# Patient Record
Sex: Male | Born: 1975 | Race: White | Hispanic: No | Marital: Single | Smoking: Never smoker
Health system: Southern US, Community
[De-identification: ages and names within clinical notes are randomized; demographics above are authoritative.]

## PROBLEM LIST (undated history)

## (undated) DIAGNOSIS — I10 Essential (primary) hypertension: Secondary | ICD-10-CM

## (undated) HISTORY — PX: APPENDECTOMY: SHX54

## (undated) HISTORY — PX: BACK SURGERY: SHX140

---

## 2006-03-04 ENCOUNTER — Emergency Department (HOSPITAL_COMMUNITY): Admission: EM | Admit: 2006-03-04 | Discharge: 2006-03-04 | Payer: Self-pay | Admitting: Emergency Medicine

## 2006-03-17 ENCOUNTER — Emergency Department (HOSPITAL_COMMUNITY): Admission: EM | Admit: 2006-03-17 | Discharge: 2006-03-17 | Payer: Self-pay | Admitting: Emergency Medicine

## 2006-03-20 ENCOUNTER — Emergency Department (HOSPITAL_COMMUNITY): Admission: EM | Admit: 2006-03-20 | Discharge: 2006-03-21 | Payer: Self-pay | Admitting: Emergency Medicine

## 2006-03-26 ENCOUNTER — Emergency Department (HOSPITAL_COMMUNITY): Admission: EM | Admit: 2006-03-26 | Discharge: 2006-03-26 | Payer: Self-pay | Admitting: Emergency Medicine

## 2006-04-11 ENCOUNTER — Emergency Department (HOSPITAL_COMMUNITY): Admission: EM | Admit: 2006-04-11 | Discharge: 2006-04-11 | Payer: Self-pay | Admitting: Emergency Medicine

## 2006-07-10 ENCOUNTER — Emergency Department (HOSPITAL_COMMUNITY): Admission: EM | Admit: 2006-07-10 | Discharge: 2006-07-11 | Payer: Self-pay | Admitting: Emergency Medicine

## 2006-07-12 ENCOUNTER — Emergency Department (HOSPITAL_COMMUNITY): Admission: EM | Admit: 2006-07-12 | Discharge: 2006-07-12 | Payer: Self-pay | Admitting: Emergency Medicine

## 2007-01-05 ENCOUNTER — Emergency Department (HOSPITAL_COMMUNITY): Admission: EM | Admit: 2007-01-05 | Discharge: 2007-01-05 | Payer: Self-pay | Admitting: Emergency Medicine

## 2007-04-14 ENCOUNTER — Emergency Department (HOSPITAL_COMMUNITY): Admission: EM | Admit: 2007-04-14 | Discharge: 2007-04-14 | Payer: Self-pay | Admitting: Emergency Medicine

## 2007-09-23 ENCOUNTER — Emergency Department (HOSPITAL_COMMUNITY): Admission: EM | Admit: 2007-09-23 | Discharge: 2007-09-23 | Payer: Self-pay | Admitting: Emergency Medicine

## 2007-10-25 ENCOUNTER — Emergency Department (HOSPITAL_COMMUNITY): Admission: EM | Admit: 2007-10-25 | Discharge: 2007-10-25 | Payer: Self-pay | Admitting: Emergency Medicine

## 2007-11-03 ENCOUNTER — Emergency Department (HOSPITAL_COMMUNITY): Admission: EM | Admit: 2007-11-03 | Discharge: 2007-11-03 | Payer: Self-pay | Admitting: Emergency Medicine

## 2007-12-18 ENCOUNTER — Emergency Department (HOSPITAL_COMMUNITY): Admission: EM | Admit: 2007-12-18 | Discharge: 2007-12-18 | Payer: Self-pay | Admitting: Emergency Medicine

## 2009-05-17 ENCOUNTER — Emergency Department (HOSPITAL_COMMUNITY): Admission: EM | Admit: 2009-05-17 | Discharge: 2009-05-17 | Payer: Self-pay | Admitting: Emergency Medicine

## 2009-08-07 ENCOUNTER — Emergency Department (HOSPITAL_COMMUNITY): Admission: EM | Admit: 2009-08-07 | Discharge: 2009-08-07 | Payer: Self-pay | Admitting: Emergency Medicine

## 2010-07-16 ENCOUNTER — Emergency Department (HOSPITAL_COMMUNITY): Admission: EM | Admit: 2010-07-16 | Discharge: 2010-07-16 | Payer: Self-pay | Admitting: Emergency Medicine

## 2011-03-26 ENCOUNTER — Emergency Department (HOSPITAL_COMMUNITY)
Admission: EM | Admit: 2011-03-26 | Discharge: 2011-03-27 | Disposition: A | Discharge status: Home / Self Care | Payer: Self-pay | Attending: Emergency Medicine | Admitting: Emergency Medicine

## 2011-03-26 DIAGNOSIS — M25569 Pain in unspecified knee: Secondary | ICD-10-CM | POA: Insufficient documentation

## 2011-03-26 DIAGNOSIS — IMO0002 Reserved for concepts with insufficient information to code with codable children: Secondary | ICD-10-CM | POA: Insufficient documentation

## 2011-03-27 ENCOUNTER — Emergency Department (HOSPITAL_COMMUNITY): Payer: Self-pay

## 2015-12-14 ENCOUNTER — Emergency Department (HOSPITAL_COMMUNITY): Payer: Self-pay

## 2015-12-14 ENCOUNTER — Encounter (HOSPITAL_COMMUNITY): Payer: Self-pay | Admitting: Emergency Medicine

## 2015-12-14 ENCOUNTER — Emergency Department (HOSPITAL_COMMUNITY)
Admission: EM | Admit: 2015-12-14 | Discharge: 2015-12-14 | Disposition: A | Discharge status: Home / Self Care | Payer: Self-pay | Attending: Emergency Medicine | Admitting: Emergency Medicine

## 2015-12-14 DIAGNOSIS — Y9389 Activity, other specified: Secondary | ICD-10-CM | POA: Insufficient documentation

## 2015-12-14 DIAGNOSIS — Z87891 Personal history of nicotine dependence: Secondary | ICD-10-CM | POA: Insufficient documentation

## 2015-12-14 DIAGNOSIS — Y990 Civilian activity done for income or pay: Secondary | ICD-10-CM | POA: Insufficient documentation

## 2015-12-14 DIAGNOSIS — I10 Essential (primary) hypertension: Secondary | ICD-10-CM | POA: Insufficient documentation

## 2015-12-14 DIAGNOSIS — Y92195 Garage of other specified residential institution as the place of occurrence of the external cause: Secondary | ICD-10-CM | POA: Insufficient documentation

## 2015-12-14 DIAGNOSIS — S20212A Contusion of left front wall of thorax, initial encounter: Secondary | ICD-10-CM | POA: Insufficient documentation

## 2015-12-14 DIAGNOSIS — W01198A Fall on same level from slipping, tripping and stumbling with subsequent striking against other object, initial encounter: Secondary | ICD-10-CM | POA: Insufficient documentation

## 2015-12-14 DIAGNOSIS — Z79899 Other long term (current) drug therapy: Secondary | ICD-10-CM | POA: Insufficient documentation

## 2015-12-14 HISTORY — DX: Essential (primary) hypertension: I10

## 2015-12-14 MED ORDER — KETOROLAC TROMETHAMINE 60 MG/2ML IM SOLN
60.0000 mg | Freq: Once | INTRAMUSCULAR | Status: AC
  Administered 2015-12-14: 60 mg via INTRAMUSCULAR
  Filled 2015-12-14: qty 2

## 2015-12-14 MED ORDER — OXYCODONE-ACETAMINOPHEN 5-325 MG PO TABS: 1.0000 | ORAL_TABLET | ORAL | Status: DC | PRN

## 2015-12-14 MED ORDER — OXYCODONE-ACETAMINOPHEN 5-325 MG PO TABS
1.0000 | ORAL_TABLET | Freq: Once | ORAL | Status: AC
  Administered 2015-12-14: 1 via ORAL
  Filled 2015-12-14: qty 1

## 2015-12-14 NOTE — Discharge Instructions (Signed)
Chest Contusion A chest contusion is a deep bruise on your chest area. Contusions are the result of an injury that caused bleeding under the skin. A chest contusion may involve bruising of the skin, muscles, or ribs. The contusion may turn blue, purple, or yellow. Minor injuries will give you a painless contusion, but more severe contusions may stay painful and swollen for a few weeks. CAUSES  A contusion is usually caused by a blow, trauma, or direct force to an area of the body. SYMPTOMS   Swelling and redness of the injured area.  Discoloration of the injured area.  Tenderness and soreness of the injured area.  Pain. DIAGNOSIS  The diagnosis can be made by taking a history and performing a physical exam. An X-ray, CT scan, or MRI may be needed to determine if there were any associated injuries, such as broken bones (fractures) or internal injuries. TREATMENT  Often, the best treatment for a chest contusion is resting, icing, and applying cold compresses to the injured area. Deep breathing exercises may be recommended to reduce the risk of pneumonia. Over-the-counter medicines may also be recommended for pain control. HOME CARE INSTRUCTIONS   Put ice on the injured area.  Put ice in a plastic bag.  Place a towel between your skin and the bag.  Leave the ice on for 15-20 minutes, 03-04 times a day.  You may also apply a heating pad for 20 minutes 3-4 times daily starting on Friday.  Only take over-the-counter or prescription medicines as directed by your caregiver. Your caregiver may recommend avoiding anti-inflammatory medicines (aspirin, ibuprofen, and naproxen) for 48 hours because these medicines may increase bruising.  Rest the injured area.  Perform deep-breathing exercises as directed by your caregiver.  Stop smoking if you smoke.  Do not lift objects over 5 pounds (2.3 kg) for 3 days or longer if recommended by your caregiver. SEEK IMMEDIATE MEDICAL CARE IF:   You  have increased bruising or swelling.  You have pain that is getting worse.  You have difficulty breathing.  You have dizziness, weakness, or fainting.  You have blood in your urine or stool.  You cough up or vomit blood.  Your swelling or pain is not relieved with medicines. MAKE SURE YOU:   Understand these instructions.  Will watch your condition.  Will get help right away if you are not doing well or get worse.   This information is not intended to replace advice given to you by your health care provider. Make sure you discuss any questions you have with your health care provider.   Document Released: 08/22/2001 Document Revised: 08/21/2012 Document Reviewed: 05/20/2012 Elsevier Interactive Patient Education 2016 [employer]   You may take the oxycodone prescribed for pain relief.  This will make you drowsy - do not drive within 4 hours of taking this medication.

## 2015-12-14 NOTE — ED Provider Notes (Signed)
CSN: [id number]     Arrival date & time 12/14/15  1027 History   First MD Initiated Contact with Patient 12/14/15 1142     Chief Complaint  Patient presents with  . Rib Injury    left     (Consider location/radiation/quality/duration/timing/severity/associated sxs/prior Treatment) The history is provided by the patient.   Anthony Oneill is a 40 y.o. male presenting with acute left sided rib pain from injury sustained when he slipped and fell hitting his work bench at home around 4:00 this morning.  He endorses a negative insomnia still is working in his garage when the incident happened.  He reports constant pain which is worsened with deep inspiration, palpation and movement.  He has a distant history of prior rib fractures and he states his pain today is similar.  He has had no medications or treatments prior to arrival for this problem.  He denies shortness of breath, except for reduced deep inspiration secondary to pain.  Denies abdominal pain, nausea, vomiting.     Past Medical History  Diagnosis Date  . Hypertension    Past Surgical History  Procedure Laterality Date  . Back surgery     No family history on file. Social History  Substance Use Topics  . Smoking status: Former Smoker    Quit date: 12/12/2015  . Smokeless tobacco: None  . Alcohol Use: No    Review of Systems  Constitutional: Negative for fever and chills.  HENT: Negative.   Eyes: Negative.   Respiratory: Negative for shortness of breath, wheezing and stridor.   Cardiovascular: Positive for chest pain.  Gastrointestinal: Negative for nausea, vomiting and abdominal pain.  Genitourinary: Negative.   Musculoskeletal: Negative for back pain and neck pain.  Skin: Negative.  Negative for wound.  Neurological: Negative for dizziness, weakness, light-headedness, numbness and headaches.  Psychiatric/Behavioral: Negative.       Allergies  Review of patient's allergies indicates no known allergies.  Home  Medications   Prior to Admission medications   Medication Sig Start Date End Date Taking? Authorizing Provider  cloNIDine (CATAPRES) 0.1 MG tablet Take 0.1 mg by mouth daily.   Yes Historical Provider, MD  hydrochlorothiazide (HYDRODIURIL) 25 MG tablet Take 25 mg by mouth daily.   Yes Historical Provider, MD   BP 147/95 mmHg  Pulse 81  Temp(Src) 98.3 F (36.8 C) (Oral)  Resp 18  Ht 6' (1.829 m)  Wt 90.719 kg  BMI 27.12 kg/m2  SpO2 100% Physical Exam  Constitutional: He appears well-developed and well-nourished.  HENT:  Head: Normocephalic and atraumatic.  Neck: Normal range of motion.  Cardiovascular: Normal rate.   Pulses equal bilaterally  Pulmonary/Chest: No respiratory distress. He has no rales. He exhibits tenderness.    Tender to palpation along the left midaxillary line.  There is no edema, hematoma, bruising, crepitus or palpable deformity.  Abdominal: Soft. Bowel sounds are normal. There is no tenderness. There is no guarding.  Neurological: He is alert. He has normal strength. He displays normal reflexes. No sensory deficit.  Skin: Skin is warm and dry.  Psychiatric: He has a normal mood and affect.    ED Course  Procedures (including critical care time) Labs Review Labs Reviewed - No data to display  Imaging Review Dg Ribs Unilateral W/chest Left  12/14/2015  CLINICAL DATA:  Fall this morning. Left rib injury/pain. Initial encounter. EXAM: LEFT RIBS AND CHEST - 3+ VIEW COMPARISON:  Left rib series 04/14/2007 FINDINGS: The cardiomediastinal silhouette is within normal  limits. The lungs are well inflated and clear. No pleural effusion or pneumothorax is seen. No rib fracture is identified. IMPRESSION: Negative. Electronically Signed   By: Sebastian AcheAllen  Grady M.D.   On: 12/14/2015 11:06   I have personally reviewed and evaluated these images and lab results as part of my medical decision-making.   EKG Interpretation None      MDM   Final diagnoses:  Rib contusion,  left, initial encounter    Radiological studies were viewed, interpreted and considered during the medical decision making and disposition process. I agree with radiologists reading.  Results were also discussed with patient.  He was placed on oxycodone for pain relief.  Also encouraged ibuprofen OTC for home use.  Reassurance given that his symptoms should improve quicker knowingly he does not have a current rib fracture.  He was given incentive spirometer however given the degree of splinting during this visit today, to help minimize the risk of pneumonia complication.  When necessary follow-up anticipated.  The patient appears reasonably screened and/or stabilized for discharge and I doubt any other medical condition or other [facility] requiring further screening, evaluation, or treatment in the ED at this time prior to discharge.      Burgess AmorJulie Maysie Parkhill, PA-C 12/15/15 1659  Lavera Guiseana Duo Liu, MD 12/15/15 2017

## 2015-12-14 NOTE — ED Notes (Signed)
Slip and fell this am, with injury to left rib.  Hit bench, rates pain 8/10.  Pain increases when breathing in.

## 2016-06-26 ENCOUNTER — Emergency Department (HOSPITAL_COMMUNITY): Payer: Self-pay

## 2016-06-26 ENCOUNTER — Encounter (HOSPITAL_COMMUNITY): Payer: Self-pay | Admitting: *Deleted

## 2016-06-26 ENCOUNTER — Emergency Department (HOSPITAL_COMMUNITY)
Admission: EM | Admit: 2016-06-26 | Discharge: 2016-06-26 | Disposition: A | Discharge status: Home / Self Care | Payer: Self-pay | Attending: Emergency Medicine | Admitting: Emergency Medicine

## 2016-06-26 DIAGNOSIS — F17200 Nicotine dependence, unspecified, uncomplicated: Secondary | ICD-10-CM | POA: Insufficient documentation

## 2016-06-26 DIAGNOSIS — R0789 Other chest pain: Secondary | ICD-10-CM | POA: Insufficient documentation

## 2016-06-26 DIAGNOSIS — Z79899 Other long term (current) drug therapy: Secondary | ICD-10-CM | POA: Insufficient documentation

## 2016-06-26 DIAGNOSIS — I10 Essential (primary) hypertension: Secondary | ICD-10-CM | POA: Insufficient documentation

## 2016-06-26 LAB — CBC
HEMATOCRIT: 38.3 % — AB (ref 39.0–52.0)
Hemoglobin: 12.6 g/dL — ABNORMAL LOW (ref 13.0–17.0)
MCH: 29 pg (ref 26.0–34.0)
MCHC: 32.9 g/dL (ref 30.0–36.0)
MCV: 88 fL (ref 78.0–100.0)
Platelets: 239 10*3/uL (ref 150–400)
RBC: 4.35 MIL/uL (ref 4.22–5.81)
RDW: 13.2 % (ref 11.5–15.5)
WBC: 5.3 10*3/uL (ref 4.0–10.5)

## 2016-06-26 LAB — BASIC METABOLIC PANEL
Anion gap: 6 (ref 5–15)
BUN: 11 mg/dL (ref 6–20)
CHLORIDE: 104 mmol/L (ref 101–111)
CO2: 28 mmol/L (ref 22–32)
CREATININE: 0.58 mg/dL — AB (ref 0.61–1.24)
Calcium: 8.8 mg/dL — ABNORMAL LOW (ref 8.9–10.3)
GFR calc Af Amer: >60 mL/min (ref >60)
GFR calc non Af Amer: >60 mL/min (ref >60)
GLUCOSE: 93 mg/dL (ref 65–99)
POTASSIUM: 4.1 mmol/L (ref 3.5–5.1)
Sodium: 138 mmol/L (ref 135–145)

## 2016-06-26 LAB — D-DIMER, QUANTITATIVE: D-Dimer, Quant: 0.34 ug/mL-FEU (ref 0.00–0.50)

## 2016-06-26 LAB — TROPONIN I
Troponin I: <0.03 ng/mL (ref <0.03)
Troponin I: <0.03 ng/mL (ref <0.03)

## 2016-06-26 MED ORDER — IOPAMIDOL (ISOVUE-370) INJECTION 76%
100.0000 mL | Freq: Once | INTRAVENOUS | Status: AC | PRN
  Administered 2016-06-26: 100 mL via INTRAVENOUS

## 2016-06-26 MED ORDER — KETOROLAC TROMETHAMINE 30 MG/ML IJ SOLN
30.0000 mg | Freq: Once | INTRAMUSCULAR | Status: AC
  Administered 2016-06-26: 30 mg via INTRAVENOUS
  Filled 2016-06-26: qty 1

## 2016-06-26 MED ORDER — IBUPROFEN 800 MG PO TABS
800.0000 mg | ORAL_TABLET | Freq: Once | ORAL | Status: AC
  Administered 2016-06-26: 800 mg via ORAL
  Filled 2016-06-26: qty 1

## 2016-06-26 MED ORDER — NAPROXEN 500 MG PO TABS
500.0000 mg | ORAL_TABLET | Freq: Two times a day (BID) | ORAL | Status: DC
Start: 2016-06-26 — End: 2017-01-05

## 2016-06-26 NOTE — ED Notes (Signed)
Pt comes in for generalized body aches, chest pain, and cough lasting 1 week. Pt states he doe shave a productive cough. Chest pain is constant but worsens with movement.

## 2016-06-26 NOTE — ED Notes (Signed)
Pt request more pain medication, EDP aware

## 2016-06-26 NOTE — ED Provider Notes (Signed)
CSN: [id number]     Arrival date & time 06/26/16  1211 History  By signing my name below, I, Anthony Oneill, attest that this documentation has been prepared under the direction and in the presence of Anthony Octave, MD. Electronically Signed: [name], ED Scribe. 06/26/2016. 12:51 PM.  Chief Complaint  Patient presents with  . Chest Pain   HPI HPI Comments: RENJI Oneill is a 40 y.o. male with a PMHx of HTN who presents to the Emergency Department complaining of gradual onset, gradually worsening, constant middle to left sided chest pain x 1 week. He notes that his pain radiates diffusely into his upper middle back. He has had an associated productive cough with green sputum, and generalized body myalgias that began after the onset of his chest pain. Pt reports that he did play hockey last night and notes that his pain was greatly worsened after playing, but denies any trauma or injury. His back and chest pain are exacerbated with coughing. He has been taking Aleve and Ibuprofen for his pain with no relief. He notes that he has sick contact with similar symptoms, and states that they were all dx'd with PNA. No hx of asthma or COPD.  Pt currently takes Clonadine for his HTN. Pt does smoke cigarettes. He denies diaphoresis, fever, HA, rhinorrhea, sore throat or near-syncopal episodes.   Past Medical History  Diagnosis Date  . Hypertension    Past Surgical History  Procedure Laterality Date  . Back surgery     No family history on file. Social History  Substance Use Topics  . Smoking status: Current Some Day Smoker    Last Attempt to Quit: 12/12/2015  . Smokeless tobacco: None  . Alcohol Use: No    Review of Systems A complete 10 system review of systems was obtained and all systems are negative except as noted in the HPI and PMH.   Allergies  Review of patient's allergies indicates no known allergies.  Home Medications   Prior to Admission medications    Medication Sig Start Date End Date Taking? Authorizing Provider  cloNIDine (CATAPRES) 0.1 MG tablet Take 0.1 mg by mouth daily.    Historical Provider, MD  hydrochlorothiazide (HYDRODIURIL) 25 MG tablet Take 25 mg by mouth daily.    Historical Provider, MD  oxyCODONE-acetaminophen (PERCOCET/ROXICET) 5-325 MG tablet Take 1 tablet by mouth every 4 (four) hours as needed. 12/14/15   Burgess Amor, PA-C   BP 163/100 mmHg  Pulse 80  Temp(Src) 98.4 F (36.9 C) (Oral)  Resp 18  Ht 6' (1.829 m)  Wt 190 lb (86.183 kg)  BMI 25.76 kg/m2  SpO2 100%   Physical Exam  Constitutional: He is oriented to person, place, and time. He appears well-developed and well-nourished.  HENT:  Head: Normocephalic and atraumatic.  Eyes: EOM are normal.  Neck: Normal range of motion.  Cardiovascular: Normal rate, regular rhythm and normal heart sounds.   Equal radial pulses.   Pulmonary/Chest: Effort normal. No respiratory distress.  Diminished breathe sound throughout,. TTP to the anterior lateral chest wall.   Abdominal: Soft. He exhibits no distension. There is no tenderness.  Musculoskeletal: Normal range of motion.  Neurological: He is alert and oriented to person, place, and time. He has normal reflexes.  Skin: Skin is warm and dry.  Psychiatric: He has a normal mood and affect. Judgment normal.  Nursing note and vitals reviewed.  ED Course  Procedures (including critical care time)  DIAGNOSTIC STUDIES: Oxygen Saturation is  100% on RA, normal by my interpretation.   COORDINATION OF CARE: 12:51 PM-Discussed next steps with pt. Pt verbalized understanding and is agreeable with the plan.   Labs Review Labs Reviewed  BASIC METABOLIC PANEL - Abnormal; Notable for the following:    Creatinine, Ser 0.58 (*)    Calcium 8.8 (*)    All other components within normal limits  CBC - Abnormal; Notable for the following:    Hemoglobin 12.6 (*)    HCT 38.3 (*)    All other components within normal limits   TROPONIN I  D-DIMER, QUANTITATIVE (NOT AT [facility])  TROPONIN I    Imaging Review Dg Chest 2 View  06/26/2016  CLINICAL DATA:  Cough, shortness of Breath, hypertension EXAM: CHEST  2 VIEW COMPARISON:  12/14/2015 FINDINGS: Cardiomediastinal silhouette is stable. Mild hyperinflation. Minimal degenerative changes lower thoracic spine. No infiltrate or pulmonary edema IMPRESSION: No active disease.  Mild hyperinflation. Electronically Signed   By: Natasha MeadLiviu  Pop M.D.   On: 06/26/2016 13:06   Ct Angio Chest/abd/pel For Dissection W And/or W/wo  06/26/2016  CLINICAL DATA:  Generalized body ache, chest pain cough starting 1 week ago EXAM: CT ANGIOGRAPHY CHEST, ABDOMEN AND PELVIS TECHNIQUE: Multidetector CT imaging through the chest, abdomen and pelvis was performed using the standard protocol during bolus administration of intravenous contrast. Multiplanar reconstructed images and MIPs were obtained and reviewed to evaluate the vascular anatomy. Unenhanced images of the chest also submitted. CONTRAST:  100 cc Isovue COMPARISON:  10/11/2011 FINDINGS: CTA CHEST FINDINGS Unenhanced images of the chest shows no aortic calcifications. No calcified hilar lymph nodes are noted. Arterial enhanced images shows no evidence of aortic aneurysm or dissection. No mediastinal hematoma or adenopathy. No pulmonary embolus is noted. A right hilar lymph node measures 1.2 cm short-axis. Left hilar lymph node measures 1.2 cm short-axis. These are borderline enlarged by size criteria. Images of the lung parenchyma shows no acute infiltrate or pulmonary edema. There is no focal consolidation. No pulmonary nodules are noted. Small hiatal hernia. Mild emphysematous changes are noted especially in upper lobes. Review of the MIP images confirms the above findings. CTA ABDOMEN AND PELVIS FINDINGS No abdominal aortic aneurysm or dissection. No periaortic leak. The celiac trunk and SMA are patent. Bilateral common iliac arteries are patent. Mild  atherosclerotic calcifications of distal abdominal aorta and common iliac arteries. Enhanced liver, pancreas, spleen and adrenal glands are unremarkable. Enhanced kidneys are symmetrical in size. No hydronephrosis or hydroureter. No small bowel obstruction.  No ascites or free air. No adenopathy. Prostate gland and seminal vesicles are unremarkable. The urinary bladder is unremarkable. No pericecal inflammation. The terminal ileum is unremarkable. The appendix is not identified. Review of the MIP images confirms the above findings. IMPRESSION: 1. There is no thoracic aortic aneurysm or aortic dissection. 2. No mediastinal hematoma or adenopathy. 3. Bilateral mild enlarged hilar lymph nodes probable reactive. 4. No acute infiltrate or pulmonary edema. 5. No abdominal aortic aneurysm or dissection. 6. No small bowel obstruction. 7. No hydronephrosis or hydroureter. 8. Mild atherosclerotic calcifications distal abdominal aorta and iliac arteries. 9. No pericecal inflammation. No abdominal ascites or free abdominal air. Electronically Signed   By: Natasha MeadLiviu  Pop M.D.   On: 06/26/2016 15:18    I have personally reviewed and evaluated these images and lab results as part of my medical decision-making.   EKG Interpretation   Date/Time:  Monday June 26 2016 12:16:29 EDT Ventricular Rate:  76 PR Interval:  134 QRS Duration: 84  QT Interval:  380 QTC Calculation: 427 R Axis:   73 Text Interpretation:  Normal sinus rhythm Normal ECG No previous ECGs  available Confirmed by Anwar Crill  MD, Darcy Cordner [phone]) on 06/26/2016 12:48:39  PM      MDM   Final diagnoses:  Atypical chest pain  Essential hypertension   1 week history of chest pain, back pain, cough and diffuse aches. Chest pain is worse with coughing and movement. His pain became worse after playing hockey yesterday but denies any direct trauma.  EKG is normal sinus rhythm. Low suspicion for ACS. Pain has been ongoing for several days and is reproducible  likely secondary to soreness from coughing.  CT Negative for dissection or other acute pathology. Troponin negative 2.  Suspect chest wall pain secondary to coughing. There is no evidence of MI or pneumonia or rib fracture or pneumothorax. Follow-up with PCP. Return precautions discussed. Patient aware of elevated blood pressure and states compliance with his clonidine. Advised further follow-up with PCP regarding his elevated blood pressure. No evidence of end organ damage.  I personally performed the services described in this documentation, which was scribed in my presence. The recorded information has been reviewed and is accurate.    Anthony Octave, MD 06/26/16 [phone]

## 2016-06-26 NOTE — Discharge Instructions (Signed)
Nonspecific Chest Pain  There is no evidence of heart attack or blood clot in the lung. Follow-up with your doctor for further evaluation of your elevated blood pressure. Return to the ED if you develop new or worsening symptoms. Chest pain can be caused by many different conditions. There is always a chance that your pain could be related to something serious, such as a heart attack or a blood clot in your lungs. Chest pain can also be caused by conditions that are not life-threatening. If you have chest pain, it is very important to follow up with your health care provider. CAUSES  Chest pain can be caused by:  Heartburn.  Pneumonia or bronchitis.  Anxiety or stress.  Inflammation around your heart (pericarditis) or lung (pleuritis or pleurisy).  A blood clot in your lung.  A collapsed lung (pneumothorax). It can develop suddenly on its own (spontaneous pneumothorax) or from trauma to the chest.  Shingles infection (varicella-zoster virus).  Heart attack.  Damage to the bones, muscles, and cartilage that make up your chest wall. This can include:  Bruised bones due to injury.  Strained muscles or cartilage due to frequent or repeated coughing or overwork.  Fracture to one or more ribs.  Sore cartilage due to inflammation (costochondritis). RISK FACTORS  Risk factors for chest pain may include:  Activities that increase your risk for trauma or injury to your chest.  Respiratory infections or conditions that cause frequent coughing.  Medical conditions or overeating that can cause heartburn.  Heart disease or family history of heart disease.  Conditions or health behaviors that increase your risk of developing a blood clot.  Having had chicken pox (varicella zoster). SIGNS AND SYMPTOMS Chest pain can feel like:  Burning or tingling on the surface of your chest or deep in your chest.  Crushing, pressure, aching, or squeezing pain.  Dull or sharp pain that is worse  when you move, cough, or take a deep breath.  Pain that is also felt in your back, neck, shoulder, or arm, or pain that spreads to any of these areas. Your chest pain may come and go, or it may stay constant. DIAGNOSIS Lab tests or other studies may be needed to find the cause of your pain. Your health care provider may have you take a test called an ambulatory ECG (electrocardiogram). An ECG records your heartbeat patterns at the time the test is performed. You may also have other tests, such as:  Transthoracic echocardiogram (TTE). During echocardiography, sound waves are used to create a picture of all of the heart structures and to look at how blood flows through your heart.  Transesophageal echocardiogram (TEE).This is a more advanced imaging test that obtains images from inside your body. It allows your health care provider to see your heart in finer detail.  Cardiac monitoring. This allows your health care provider to monitor your heart rate and rhythm in real time.  Holter monitor. This is a portable device that records your heartbeat and can help to diagnose abnormal heartbeats. It allows your health care provider to track your heart activity for several days, if needed.  Stress tests. These can be done through exercise or by taking medicine that makes your heart beat more quickly.  Blood tests.  Imaging tests. TREATMENT  Your treatment depends on what is causing your chest pain. Treatment may include:  Medicines. These may include:  Acid blockers for heartburn.  Anti-inflammatory medicine.  Pain medicine for inflammatory conditions.  Antibiotic medicine,  if an infection is present.  Medicines to dissolve blood clots.  Medicines to treat coronary artery disease.  Supportive care for conditions that do not require medicines. This may include:  Resting.  Applying heat or cold packs to injured areas.  Limiting activities until pain decreases. HOME CARE  INSTRUCTIONS  If you were prescribed an antibiotic medicine, finish it all even if you start to feel better.  Avoid any activities that bring on chest pain.  Do not use any tobacco products, including cigarettes, chewing tobacco, or electronic cigarettes. If you need help quitting, ask your health care provider.  Do not drink alcohol.  Take medicines only as directed by your health care provider.  Keep all follow-up visits as directed by your health care provider. This is important. This includes any further testing if your chest pain does not go away.  If heartburn is the cause for your chest pain, you may be told to keep your head raised (elevated) while sleeping. This reduces the chance that acid will go from your stomach into your esophagus.  Make lifestyle changes as directed by your health care provider. These may include:  Getting regular exercise. Ask your health care provider to suggest some activities that are safe for you.  Eating a heart-healthy diet. A registered dietitian can help you to learn healthy eating options.  Maintaining a healthy weight.  Managing diabetes, if necessary.  Reducing stress. SEEK MEDICAL CARE IF:  Your chest pain does not go away after treatment.  You have a rash with blisters on your chest.  You have a fever. SEEK IMMEDIATE MEDICAL CARE IF:   Your chest pain is worse.  You have an increasing cough, or you cough up blood.  You have severe abdominal pain.  You have severe weakness.  You faint.  You have chills.  You have sudden, unexplained chest discomfort.  You have sudden, unexplained discomfort in your arms, back, neck, or jaw.  You have shortness of breath at any time.  You suddenly start to sweat, or your skin gets clammy.  You feel nauseous or you vomit.  You suddenly feel light-headed or dizzy.  Your heart begins to beat quickly, or it feels like it is skipping beats. These symptoms may represent a serious  problem that is an emergency. Do not wait to see if the symptoms will go away. Get medical help right away. Call your local emergency services (911 in the [place] Do not drive yourself to the hospital.   This information is not intended to replace advice given to you by your health care provider. Make sure you discuss any questions you have with your health care provider.   Document Released: 09/06/2005 Document Revised: 12/18/2014 Document Reviewed: 07/03/2014 Elsevier Interactive Patient Education [employer]2016 [employer]

## 2017-01-05 ENCOUNTER — Emergency Department (HOSPITAL_COMMUNITY)
Admission: EM | Admit: 2017-01-05 | Discharge: 2017-01-05 | Disposition: A | Discharge status: Home / Self Care | Payer: Self-pay | Attending: Emergency Medicine | Admitting: Emergency Medicine

## 2017-01-05 ENCOUNTER — Emergency Department (HOSPITAL_COMMUNITY): Payer: Self-pay

## 2017-01-05 ENCOUNTER — Encounter (HOSPITAL_COMMUNITY): Payer: Self-pay | Admitting: *Deleted

## 2017-01-05 DIAGNOSIS — M542 Cervicalgia: Secondary | ICD-10-CM | POA: Insufficient documentation

## 2017-01-05 DIAGNOSIS — I10 Essential (primary) hypertension: Secondary | ICD-10-CM | POA: Insufficient documentation

## 2017-01-05 DIAGNOSIS — M792 Neuralgia and neuritis, unspecified: Secondary | ICD-10-CM

## 2017-01-05 DIAGNOSIS — Z79899 Other long term (current) drug therapy: Secondary | ICD-10-CM | POA: Insufficient documentation

## 2017-01-05 DIAGNOSIS — M5410 Radiculopathy, site unspecified: Secondary | ICD-10-CM | POA: Insufficient documentation

## 2017-01-05 DIAGNOSIS — F17200 Nicotine dependence, unspecified, uncomplicated: Secondary | ICD-10-CM | POA: Insufficient documentation

## 2017-01-05 LAB — BASIC METABOLIC PANEL
Anion gap: 7 (ref 5–15)
BUN: 16 mg/dL (ref 6–20)
CHLORIDE: 104 mmol/L (ref 101–111)
CO2: 28 mmol/L (ref 22–32)
Calcium: 9.4 mg/dL (ref 8.9–10.3)
Creatinine, Ser: 0.8 mg/dL (ref 0.61–1.24)
GFR calc Af Amer: >60 mL/min (ref >60)
GFR calc non Af Amer: >60 mL/min (ref >60)
GLUCOSE: 89 mg/dL (ref 65–99)
POTASSIUM: 3.9 mmol/L (ref 3.5–5.1)
Sodium: 139 mmol/L (ref 135–145)

## 2017-01-05 LAB — CBC WITH DIFFERENTIAL/PLATELET
Basophils Absolute: 0.1 10*3/uL (ref 0.0–0.1)
Basophils Relative: 1 %
EOS PCT: 2 %
Eosinophils Absolute: 0.1 10*3/uL (ref 0.0–0.7)
HEMATOCRIT: 44.9 % (ref 39.0–52.0)
HEMOGLOBIN: 15.1 g/dL (ref 13.0–17.0)
LYMPHS ABS: 3.5 10*3/uL (ref 0.7–4.0)
LYMPHS PCT: 44 %
MCH: 30.9 pg (ref 26.0–34.0)
MCHC: 33.6 g/dL (ref 30.0–36.0)
MCV: 92 fL (ref 78.0–100.0)
Monocytes Absolute: 0.7 10*3/uL (ref 0.1–1.0)
Monocytes Relative: 9 %
NEUTROS ABS: 3.7 10*3/uL (ref 1.7–7.7)
Neutrophils Relative %: 46 %
Platelets: 282 10*3/uL (ref 150–400)
RBC: 4.88 MIL/uL (ref 4.22–5.81)
RDW: 13.6 % (ref 11.5–15.5)
WBC: 8.1 10*3/uL (ref 4.0–10.5)

## 2017-01-05 MED ORDER — DEXAMETHASONE SODIUM PHOSPHATE 4 MG/ML IJ SOLN
8.0000 mg | Freq: Once | INTRAMUSCULAR | Status: AC
  Administered 2017-01-05: 8 mg via INTRAVENOUS
  Filled 2017-01-05: qty 2

## 2017-01-05 MED ORDER — MORPHINE SULFATE (PF) 4 MG/ML IV SOLN
4.0000 mg | Freq: Once | INTRAVENOUS | Status: AC
  Administered 2017-01-05: 4 mg via INTRAVENOUS
  Filled 2017-01-05: qty 1

## 2017-01-05 MED ORDER — OXYCODONE-ACETAMINOPHEN 5-325 MG PO TABS: 1.0000 | ORAL_TABLET | ORAL | 0 refills | Status: DC | PRN

## 2017-01-05 MED ORDER — PREDNISONE 10 MG (21) PO TBPK: 10.0000 mg | ORAL_TABLET | Freq: Every day | ORAL | 0 refills | Status: DC

## 2017-01-05 MED ORDER — ONDANSETRON HCL 4 MG/2ML IJ SOLN
4.0000 mg | Freq: Once | INTRAMUSCULAR | Status: AC
  Administered 2017-01-05: 4 mg via INTRAVENOUS
  Filled 2017-01-05: qty 2

## 2017-01-05 MED ORDER — KETOROLAC TROMETHAMINE 30 MG/ML IJ SOLN
30.0000 mg | Freq: Once | INTRAMUSCULAR | Status: AC
  Administered 2017-01-05: 30 mg via INTRAVENOUS
  Filled 2017-01-05: qty 1

## 2017-01-05 MED ORDER — DICLOFENAC SODIUM 50 MG PO TBEC: 50.0000 mg | DELAYED_RELEASE_TABLET | Freq: Two times a day (BID) | ORAL | 0 refills | Status: DC

## 2017-01-05 MED ORDER — SODIUM CHLORIDE 0.9 % IV BOLUS (SEPSIS)
500.0000 mL | Freq: Once | INTRAVENOUS | Status: AC
  Administered 2017-01-05: 500 mL via INTRAVENOUS

## 2017-01-05 NOTE — ED Provider Notes (Signed)
[facility]-EMERGENCY DEPT Provider Note   CSN: [id number] Arrival date & time: 01/05/17  1035  By signing my name below, I, Javier Docker, attest that this documentation has been prepared under the direction and in the presence of Donnetta Hutching, MD. Electronically Signed: Javier Docker, ER Scribe. 07/22/2016. 12:02 PM.  History   Chief Complaint Chief Complaint  Patient presents with  . Neck Pain  . Arm Pain   The history is provided by the patient. No language interpreter was used.   HPI Comments: Anthony Oneill is a 41 y.o. male who presents to the Emergency Department complaining of neck pain and left shoulder pain with associated numbness in fingers 1, 2, 3. His pain is worse when he bends his neck to the left. He does a manual labor job and plays hockey. He denies MOI. His PCP is Dr. Jeanett Schlein but he has not seen him in some time. He has a PMHx of HTN, and he took his BP medication this morning.   Past Medical History:  Diagnosis Date  . Hypertension     There are no active problems to display for this patient.   Past Surgical History:  Procedure Laterality Date  . APPENDECTOMY    . BACK SURGERY       Home Medications    Prior to Admission medications   Medication Sig Start Date End Date Taking? Authorizing Provider  cloNIDine (CATAPRES) 0.2 MG tablet Take 0.2 mg by mouth 2 (two) times daily.   Yes Historical Provider, MD  ibuprofen (ADVIL,MOTRIN) 200 MG tablet Take 800 mg by mouth every 6 (six) hours as needed for moderate pain.   Yes Historical Provider, MD  diclofenac (VOLTAREN) 50 MG EC tablet Take 1 tablet (50 mg total) by mouth 2 (two) times daily. 01/05/17   Donnetta Hutching, MD  oxyCODONE-acetaminophen (PERCOCET) 5-325 MG tablet Take 1-2 tablets by mouth every 4 (four) hours as needed. 01/05/17   Donnetta Hutching, MD  predniSONE (STERAPRED UNI-PAK 21 TAB) 10 MG (21) TBPK tablet Take 1 tablet (10 mg total) by mouth daily. 6 tablets day 1; 5 tablets day 2; 4 tablets day 3; 3  tablets day 4; 2 tablets day 5; 1 tablet day 6 01/05/17   Donnetta Hutching, MD    Family History No family history on file.  Social History Social History  Substance Use Topics  . Smoking status: Current Some Day Smoker    Last attempt to quit: 12/12/2015  . Smokeless tobacco: Never Used  . Alcohol use No     Allergies   Patient has no known allergies.   Review of Systems Review of Systems  Constitutional: Negative for chills and fever.  Neurological: Positive for weakness and numbness.  A complete 10 system review of systems was obtained and all systems are negative except as noted in the HPI and PMH.    Physical Exam Updated Vital Signs BP 162/99   Pulse 80   Temp 98 F (36.7 C) (Oral)   Resp 18   Ht 6' (1.829 m)   Wt 190 lb (86.2 kg)   SpO2 98%   BMI 25.77 kg/m   Physical Exam  Constitutional: He is oriented to person, place, and time. He appears well-developed and well-nourished. No distress.  HENT:  Head: Normocephalic and atraumatic.  Eyes: Pupils are equal, round, and reactive to light.  Neck: Neck supple.  Cardiovascular: Normal rate.   Elevated BP at 171/112.   Pulmonary/Chest: Effort normal. No respiratory distress.  Musculoskeletal: Normal range of motion.  TTP in left shoulder, neck and cervical spine. ROM intact in left shoulder and arm with some pain.   Neurological: He is alert and oriented to person, place, and time. Coordination normal.  Numbness in the digits 1, 2, 3. Drip strength 4/5 on left.   Skin: Skin is warm and dry. He is not diaphoretic.  Psychiatric: He has a normal mood and affect. His behavior is normal.  Nursing note and vitals reviewed.    ED Treatments / Results  DIAGNOSTIC STUDIES: Oxygen Saturation is 96% on RA, normal by my interpretation.    COORDINATION OF CARE: 11:56 AM Discussed treatment plan with pt at bedside and pt agreed to plan.  Labs (all labs ordered are listed, but only abnormal results are displayed) Labs  Reviewed  CBC WITH DIFFERENTIAL/PLATELET  BASIC METABOLIC PANEL    EKG  EKG Interpretation None       Radiology No results found.  Procedures Procedures (including critical care time)  Medications Ordered in ED Medications  sodium chloride 0.9 % bolus 500 mL (0 mLs Intravenous Stopped 01/05/17 1355)  ondansetron (ZOFRAN) injection 4 mg (4 mg Intravenous Given 01/05/17 1225)  morphine 4 MG/ML injection 4 mg (4 mg Intravenous Given 01/05/17 1226)  ketorolac (TORADOL) 30 MG/ML injection 30 mg (30 mg Intravenous Given 01/05/17 1226)  morphine 4 MG/ML injection 4 mg (4 mg Intravenous Given 01/05/17 1350)  dexamethasone (DECADRON) injection 8 mg (8 mg Intravenous Given 01/05/17 1349)     Initial Impression / Assessment and Plan / ED Course  I have reviewed the triage vital signs and the nursing notes.  Pertinent labs & imaging results that were available during my care of the patient were reviewed by me and considered in my medical decision making (see chart for details).    MRI of cervical spine reviewed and discussed with neurosurgeon.  Dr. Marikay Alaravid Jones. He will see him in follow-up. Discharge medications diclofenac, Percocet, prednisone. Discussed with patient and his wife.   Final Clinical Impressions(s) / ED Diagnoses   Final diagnoses:  Neck pain  Radicular pain of left upper extremity    New Prescriptions Discharge Medication List as of 01/05/2017  3:05 PM    START taking these medications   Details  diclofenac (VOLTAREN) 50 MG EC tablet Take 1 tablet (50 mg total) by mouth 2 (two) times daily., Starting Fri 01/05/2017, Print    oxyCODONE-acetaminophen (PERCOCET) 5-325 MG tablet Take 1-2 tablets by mouth every 4 (four) hours as needed., Starting Fri 01/05/2017, Print    predniSONE (STERAPRED UNI-PAK 21 TAB) 10 MG (21) TBPK tablet Take 1 tablet (10 mg total) by mouth daily. 6 tablets day 1; 5 tablets day 2; 4 tablets day 3; 3 tablets day 4; 2 tablets day 5; 1 tablet day 6,  Starting Fri 01/05/2017, Print         I personally performed the services described in this documentation, which was scribed in my presence. The recorded information has been reviewed and is accurate.         Donnetta HutchingBrian Findley Blankenbaker, MD 01/10/17 [phone]

## 2017-01-05 NOTE — Discharge Instructions (Signed)
Prescriptions for pain meds, inflammation, prednisone. Take all prescriptions with food. Follow-up with neurosurgeon on Tuesday, January 30. Phone number and address given in discharge instructions.

## 2017-01-05 NOTE — ED Notes (Signed)
Pt is ambulatory and can move arms equally. Pt has lesser grip on left arm. Pt states this is related to increased pain when he squeezes. Pt denies any speech problems. Pain is reproducible when patient moves his head and left arm.

## 2017-01-05 NOTE — ED Triage Notes (Signed)
Pt states he starting having left arm and neck pain 2 weeks ago. Pt denies any injury. States she he moves his head to the left the pain starts at his neck and moves down his left arm. Pt has intermittent numbness to his left hand thumb, index, and middle finger. Denies any chest pain.

## 2017-03-03 ENCOUNTER — Emergency Department (HOSPITAL_COMMUNITY)
Admission: EM | Admit: 2017-03-03 | Discharge: 2017-03-03 | Disposition: A | Discharge status: Home / Self Care | Payer: Self-pay | Attending: Emergency Medicine | Admitting: Emergency Medicine

## 2017-03-03 ENCOUNTER — Encounter (HOSPITAL_COMMUNITY): Payer: Self-pay | Admitting: Emergency Medicine

## 2017-03-03 DIAGNOSIS — Z79899 Other long term (current) drug therapy: Secondary | ICD-10-CM | POA: Insufficient documentation

## 2017-03-03 DIAGNOSIS — I10 Essential (primary) hypertension: Secondary | ICD-10-CM | POA: Insufficient documentation

## 2017-03-03 DIAGNOSIS — M5412 Radiculopathy, cervical region: Secondary | ICD-10-CM | POA: Insufficient documentation

## 2017-03-03 MED ORDER — HYDROCODONE-ACETAMINOPHEN 5-325 MG PO TABS
1.0000 | ORAL_TABLET | Freq: Once | ORAL | Status: AC
  Administered 2017-03-03: 1 via ORAL
  Filled 2017-03-03: qty 1

## 2017-03-03 MED ORDER — PREDNISONE 50 MG PO TABS
60.0000 mg | ORAL_TABLET | Freq: Once | ORAL | Status: AC
  Administered 2017-03-03: 60 mg via ORAL
  Filled 2017-03-03: qty 1

## 2017-03-03 MED ORDER — PREDNISONE 10 MG PO TABS: 40.0000 mg | ORAL_TABLET | Freq: Every day | ORAL | 0 refills | Status: AC

## 2017-03-03 MED ORDER — HYDROCODONE-ACETAMINOPHEN 5-325 MG PO TABS
2.0000 | ORAL_TABLET | Freq: Four times a day (QID) | ORAL | 0 refills | Status: AC | PRN
Start: 2017-03-03 — End: ?

## 2017-03-03 NOTE — ED Provider Notes (Signed)
[facility]-EMERGENCY DEPT Provider Note   CSN: [id number] Arrival date & time: 03/03/17  1041     History   Chief Complaint Chief Complaint  Patient presents with  . Neck Injury    HPI Anthony Oneill is a 41 y.o. male presenting with two days of sudden onset recurring left arm shooting pain and burning sensation with paresthesia of 1st, 2nd and third left digits in a median nerve distribution. This is aggravated with lateral flexion of the neck to the affected side. He was seen in ED in January for same and MRI showed cervical disc pathology and narrowing. He was given a steroid and referral to neurosurgery. Since he began to feel better after that visit, he failed to follow up. He didn't have symptoms until two days ago. He is scheduled to see the orthopedic doctor in a few days. Denies new focal deficits, h/a, fever, chills, swelling, skin color changes or other symptoms.  HPI  Past Medical History:  Diagnosis Date  . Hypertension     There are no active problems to display for this patient.   Past Surgical History:  Procedure Laterality Date  . APPENDECTOMY    . BACK SURGERY         Home Medications    Prior to Admission medications   Medication Sig Start Date End Date Taking? Authorizing Provider  cloNIDine (CATAPRES) 0.2 MG tablet Take 0.2 mg by mouth 2 (two) times daily.   Yes Historical Provider, MD  ibuprofen (ADVIL,MOTRIN) 200 MG tablet Take 800 mg by mouth every 6 (six) hours as needed for moderate pain.   Yes Historical Provider, MD  HYDROcodone-acetaminophen (NORCO/VICODIN) 5-325 MG tablet Take 2 tablets by mouth every 6 (six) hours as needed for severe pain. 03/03/17   Georgiana Shore, PA-C  predniSONE (DELTASONE) 10 MG tablet Take 4 tablets (40 mg total) by mouth daily. 03/03/17 03/08/17  Georgiana Shore, PA-C    Family History History reviewed. No pertinent family history.  Social History Social History  Substance Use Topics  . Smoking status: Never  Smoker  . Smokeless tobacco: Never Used  . Alcohol use No     Allergies   Patient has no known allergies.   Review of Systems Review of Systems  Constitutional: Negative for chills and fever.  HENT: Negative for ear pain and hearing loss.   Eyes: Negative for visual disturbance.  Respiratory: Negative for shortness of breath and wheezing.   Cardiovascular: Negative for chest pain, palpitations and leg swelling.  Gastrointestinal: Negative for nausea and vomiting.  Musculoskeletal: Positive for neck pain. Negative for gait problem and joint swelling.  Neurological: Negative for dizziness, tremors, facial asymmetry, weakness, numbness and headaches.  Psychiatric/Behavioral: Negative for behavioral problems and confusion.     Physical Exam Updated Vital Signs BP (!) 151/111 (BP Location: Right Arm)   Pulse 92   Temp 98.4 F (36.9 C) (Oral)   Resp 16   Ht 6' (1.829 m)   Wt 86.2 kg   SpO2 100%   BMI 25.77 kg/m   Physical Exam  Constitutional: He appears well-developed and well-nourished. No distress.  Afebrile, non-toxic, sitting in bed in no acute distress  HENT:  Head: Normocephalic and atraumatic.  Eyes: Conjunctivae and EOM are normal. Pupils are equal, round, and reactive to light.  Neck: Normal range of motion. Neck supple.  Cardiovascular: Normal rate, regular rhythm, normal heart sounds and intact distal pulses.   No murmur heard. Pulmonary/Chest: Effort normal and breath sounds  normal. No respiratory distress. He has no wheezes. He has no rales.  Musculoskeletal: Normal range of motion. He exhibits no edema or deformity.  Patient has full rom of left shoulder and neck. 5/5 strength bilaterally. Strong grips.   Neurological: He is alert. No cranial nerve deficit or sensory deficit. He exhibits normal muscle tone.  No gross focal neuro deficits in upper extremities  Neurologic Exam:   - Mental status: Patient is alert and cooperative. Fluent speech and words  are clear. Coherent thought processes and insight is good. Patient is oriented x 4 to person, place, time and event.   - Cranial nerves:  CN III, IV, VI: pupils equally round, reactive to light both direct and conscensual and normal accommodation. Full extra-ocular movement. CN V: motor temporalis and masseter strength intact. CN VII : muscles of facial expression intact. CN X :  midline uvula. XI strength of sternocleidomastoid and trapezius muscles 5/5, XII: tongue is midline when protruded.  - Motor: No involuntary movements. Muscle tone and bulk normal throughout. Muscle strength is 5/5 in bilateral shoulder abduction, elbow flexion and extension, wrist flexion and extension, thumb opposition, grip.  - Sensory: Proprioception, light tough sensation grossly intact in upper extremities.  Skin: Skin is warm and dry. Capillary refill takes less than 2 seconds. He is not diaphoretic. No pallor.  Psychiatric: He has a normal mood and affect. His behavior is normal.  Nursing note and vitals reviewed.    ED Treatments / Results  Labs (all labs ordered are listed, but only abnormal results are displayed) Labs Reviewed - No data to display  EKG  EKG Interpretation None       Radiology No results found.  Procedures Procedures (including critical care time)  Medications Ordered in ED Medications  predniSONE (DELTASONE) tablet 60 mg (60 mg Oral Given 03/03/17 1225)  HYDROcodone-acetaminophen (NORCO/VICODIN) 5-325 MG per tablet 1 tablet (1 tablet Oral Given 03/03/17 1226)     Initial Impression / Assessment and Plan / ED Course  I have reviewed the triage vital signs and the nursing notes.  Pertinent labs & imaging results that were available during my care of the patient were reviewed by me and considered in my medical decision making (see chart for details).     Patient presents with chronic left cervical radiculopathy that had improved and now recurred. He did not follow up with  neurosurgery referral back in early February as he had improved from steroid burst and pain medications.  Reassuring exam. No focal deficits. Patient is afebrile and non-toxic appearing.  Patient's pain managed in ED. Given ice, vicodin and steroid.On reassessment, his pain had improved.   Will discharge home with prednisone burst and pain management with close follow up with ortho/neurosurgery. Patient already has appointment scheduled.  Patient was discussed with Dr. Jacqulyn BathLong who  agrees with assessment and plan.  Discussed strict return precautions and advised to return to the emergency department if experiencing any new or worsening symptoms. Instructions were understood and patient agreed with discharge plan.  Final Clinical Impressions(s) / ED Diagnoses   Final diagnoses:  Cervical radiculopathy    New Prescriptions Discharge Medication List as of 03/03/2017  1:25 PM    START taking these medications   Details  HYDROcodone-acetaminophen (NORCO/VICODIN) 5-325 MG tablet Take 2 tablets by mouth every 6 (six) hours as needed for severe pain., Starting Sat 03/03/2017, Print    predniSONE (DELTASONE) 10 MG tablet Take 4 tablets (40 mg total) by mouth daily., Starting Sat  03/03/2017, Until Thu 03/08/2017, Print         Georgiana Shore, PA-C 03/03/17 2243    Maia Plan, MD 03/04/17 [phone]

## 2017-03-03 NOTE — ED Notes (Signed)
Awaiting evaluation and disposition

## 2017-03-03 NOTE — ED Triage Notes (Signed)
Pt reports having neck pain going down into shoulders with no injury.  This happened a few months ago and was a disk problem per pt.  Never followed up because it began feeling better.  Has appt on Tuesday with ortho.

## 2017-03-03 NOTE — Discharge Instructions (Signed)
As discussed, avoid aggravating activities, use ice and medications to help with pain and inflammation. Go to your appointment on Tuesday for follow up. Return to the Emergency department if symptoms worsen in the meantime. (loss of sensation, swelling, worsening pain or any other new concerning symptoms.

## 2017-03-03 NOTE — ED Notes (Signed)
Awaiting recheck and dispo 

## 2017-03-03 NOTE — ED Notes (Signed)
Recheck by PA.

## 2017-03-03 NOTE — ED Notes (Signed)
Pt is aware of his htn hx and has been on HCTZ in past but reports it "made me pee all the time". Pt encouraged to take meds as part of the purpose of med is to rid body of fluid thus lowering his BP

## 2017-03-03 NOTE — ED Notes (Signed)
Pt reports that he has a cervical disc problem was supposed to see a neuro surgeon and did not go- he reports he felt better and missed the appointment- he now is experiencing neck discomfort and has an appointment on the 27th of this month but is here for pain relief

## 2018-09-23 IMAGING — DX DG CERVICAL SPINE COMPLETE 4+V
7 series · 7 of 7 positions shown · non-contrast
Comparison: None.

CLINICAL DATA: Neck and left arm pain for 2 weeks without known
injury.

EXAM:
CERVICAL SPINE - COMPLETE 4+ VIEW

[c-spine lat]
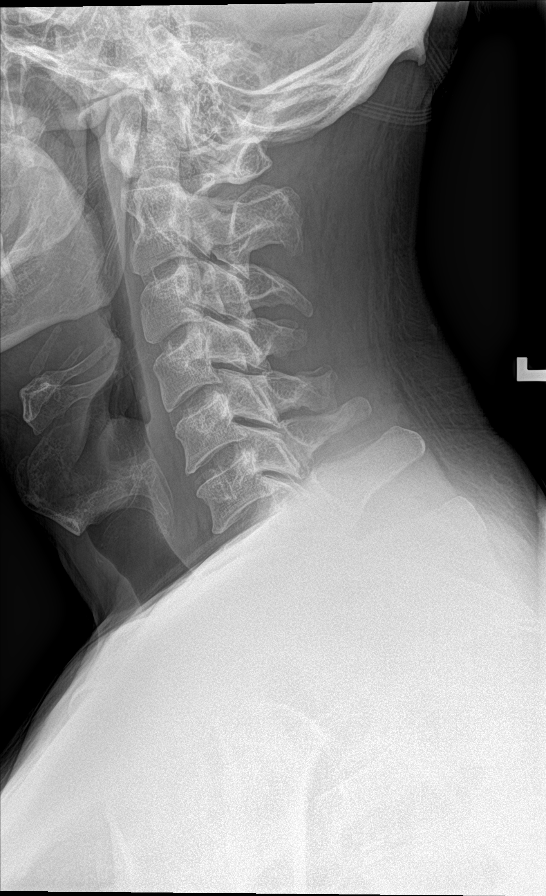

[c-spine obl (1 of 2)]
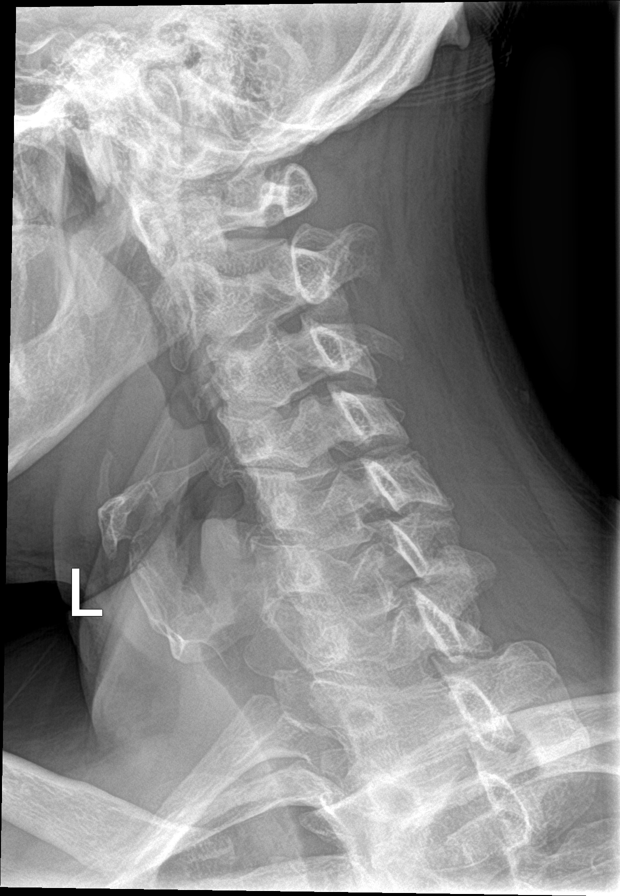

[c-spine obl (2 of 2)]
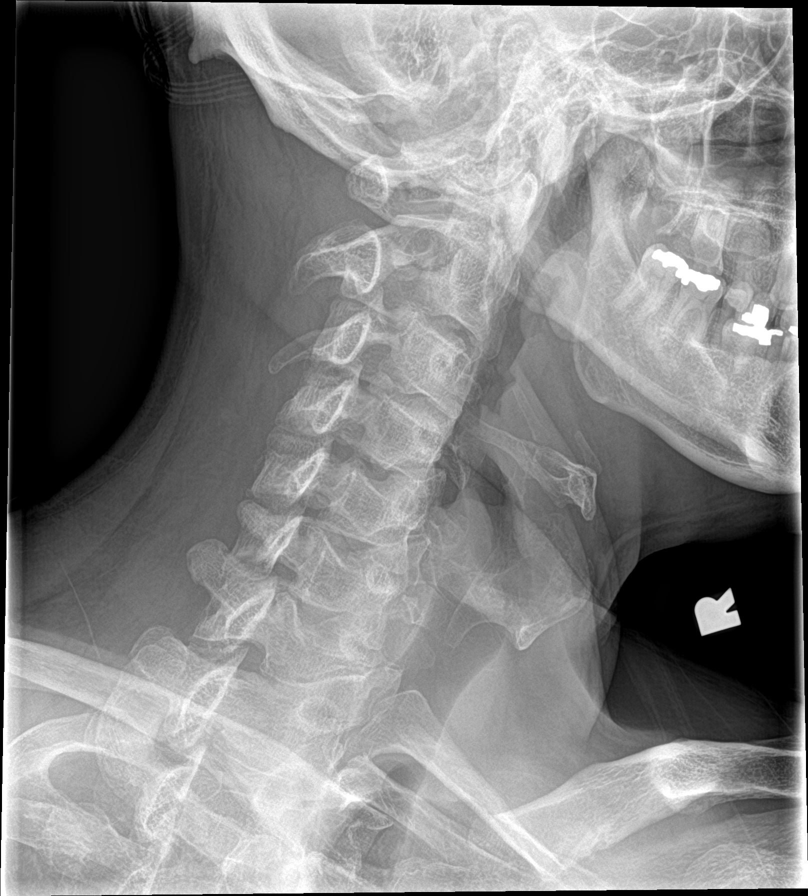

[c-spine ap]
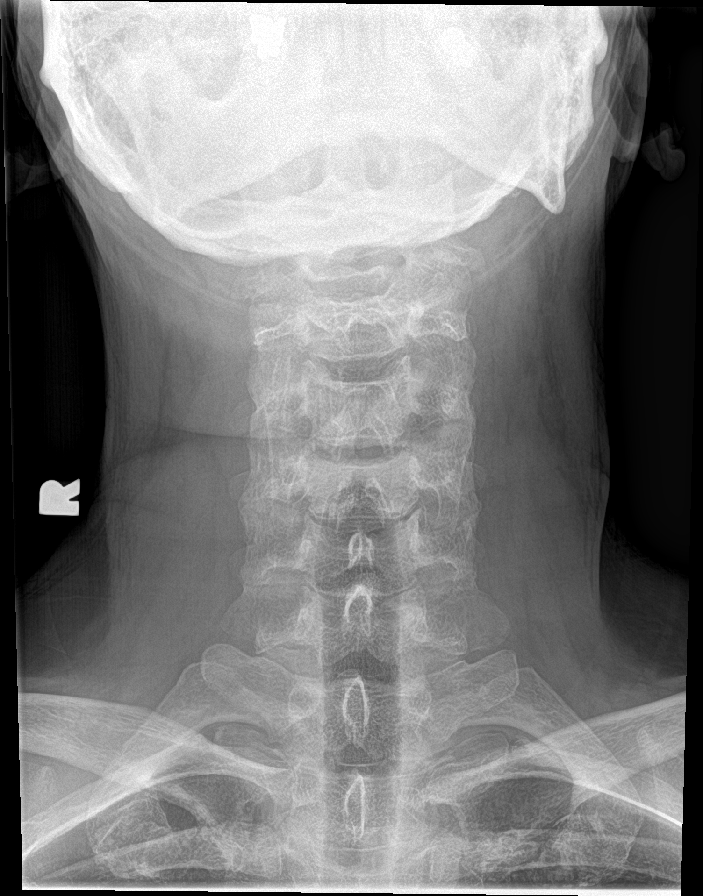

[c-spine open mouth (1 of 2)]
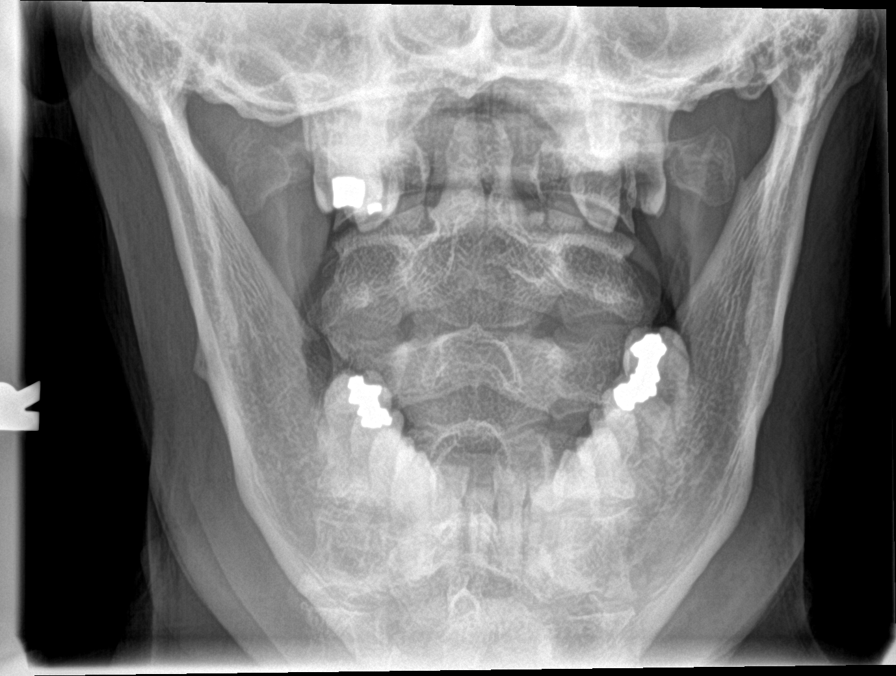

[c-spine swimmers trauma]
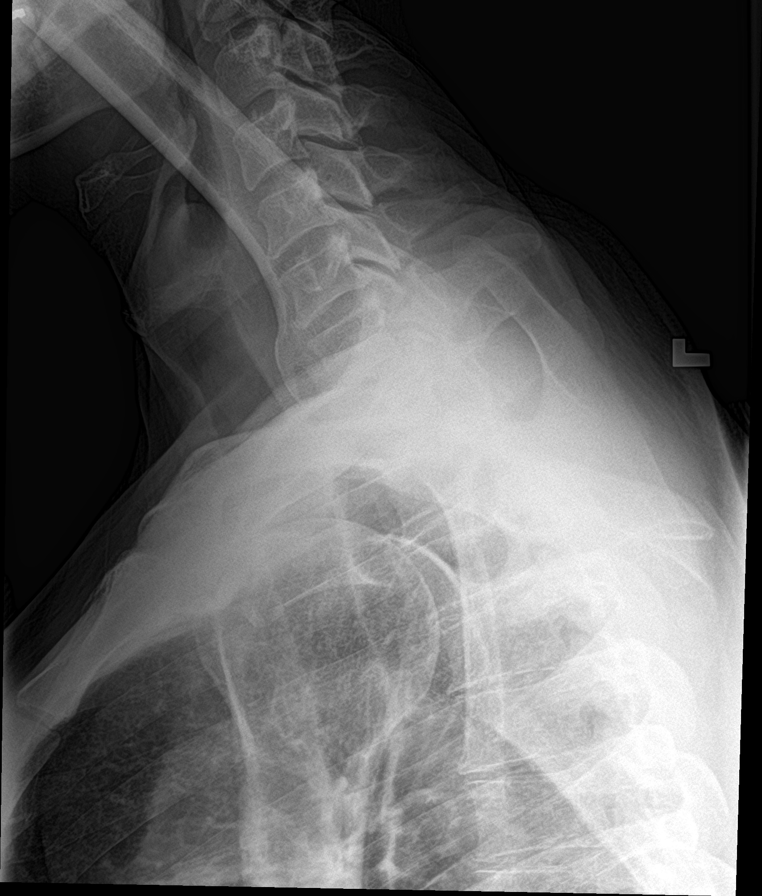

[c-spine open mouth (2 of 2)]
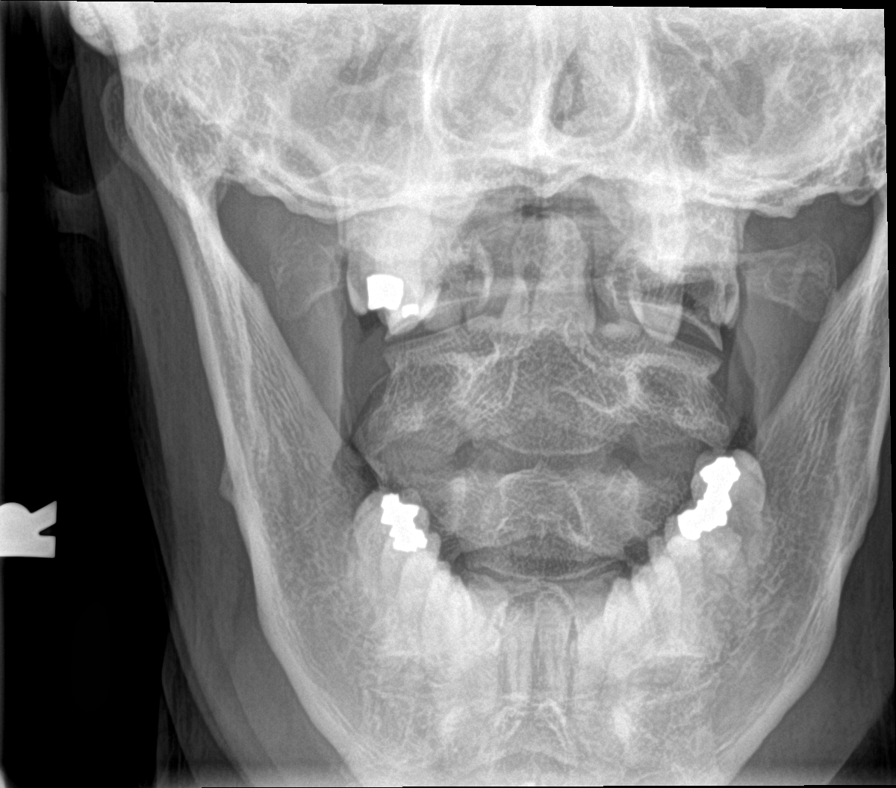

[7 of 7 positions shown; findings below may reference images not displayed]

FINDINGS: No fracture or spondylolisthesis is noted. No prevertebral soft
tissue swelling is noted. Disc spaces are well-maintained bilateral
neural foraminal stenosis is noted at C5-6 and C6-7 secondary to
uncovertebral spurring.
IMPRESSION: Bilateral neural foraminal stenosis is C5-6 and C6-7 secondary to
uncovertebral spurring. No other abnormality seen in the cervical
spine.
